# Patient Record
Sex: Female | Born: 1975 | Race: White | Hispanic: No | Marital: Married | Smoking: Never smoker
Health system: Southern US, Community
[De-identification: ages and names within clinical notes are randomized; demographics above are authoritative.]

---

## 2008-10-28 ENCOUNTER — Ambulatory Visit: Payer: Self-pay | Admitting: Internal Medicine

## 2009-10-19 ENCOUNTER — Encounter: Admission: RE | Admit: 2009-10-19 | Discharge: 2009-10-19 | Payer: Self-pay | Admitting: Occupational Medicine

## 2010-01-18 ENCOUNTER — Encounter: Admission: RE | Admit: 2010-01-18 | Discharge: 2010-01-18 | Payer: Self-pay | Admitting: Occupational Medicine

## 2010-05-01 ENCOUNTER — Ambulatory Visit: Payer: Self-pay | Admitting: Internal Medicine

## 2010-12-01 ENCOUNTER — Ambulatory Visit: Payer: Self-pay | Admitting: Family Medicine

## 2011-10-04 ENCOUNTER — Ambulatory Visit: Payer: Self-pay | Admitting: Family Medicine

## 2012-07-26 IMAGING — CR DG HAND COMPLETE 3+V*L*
3 series · 3 of 3 positions shown · non-contrast
Comparison: None.

CLINICAL DATA: Fall.  Left hand pain and swelling especially along
the thumb.

LEFT HAND - COMPLETE 3+ VIEW

[view not recorded (1 of 3)]
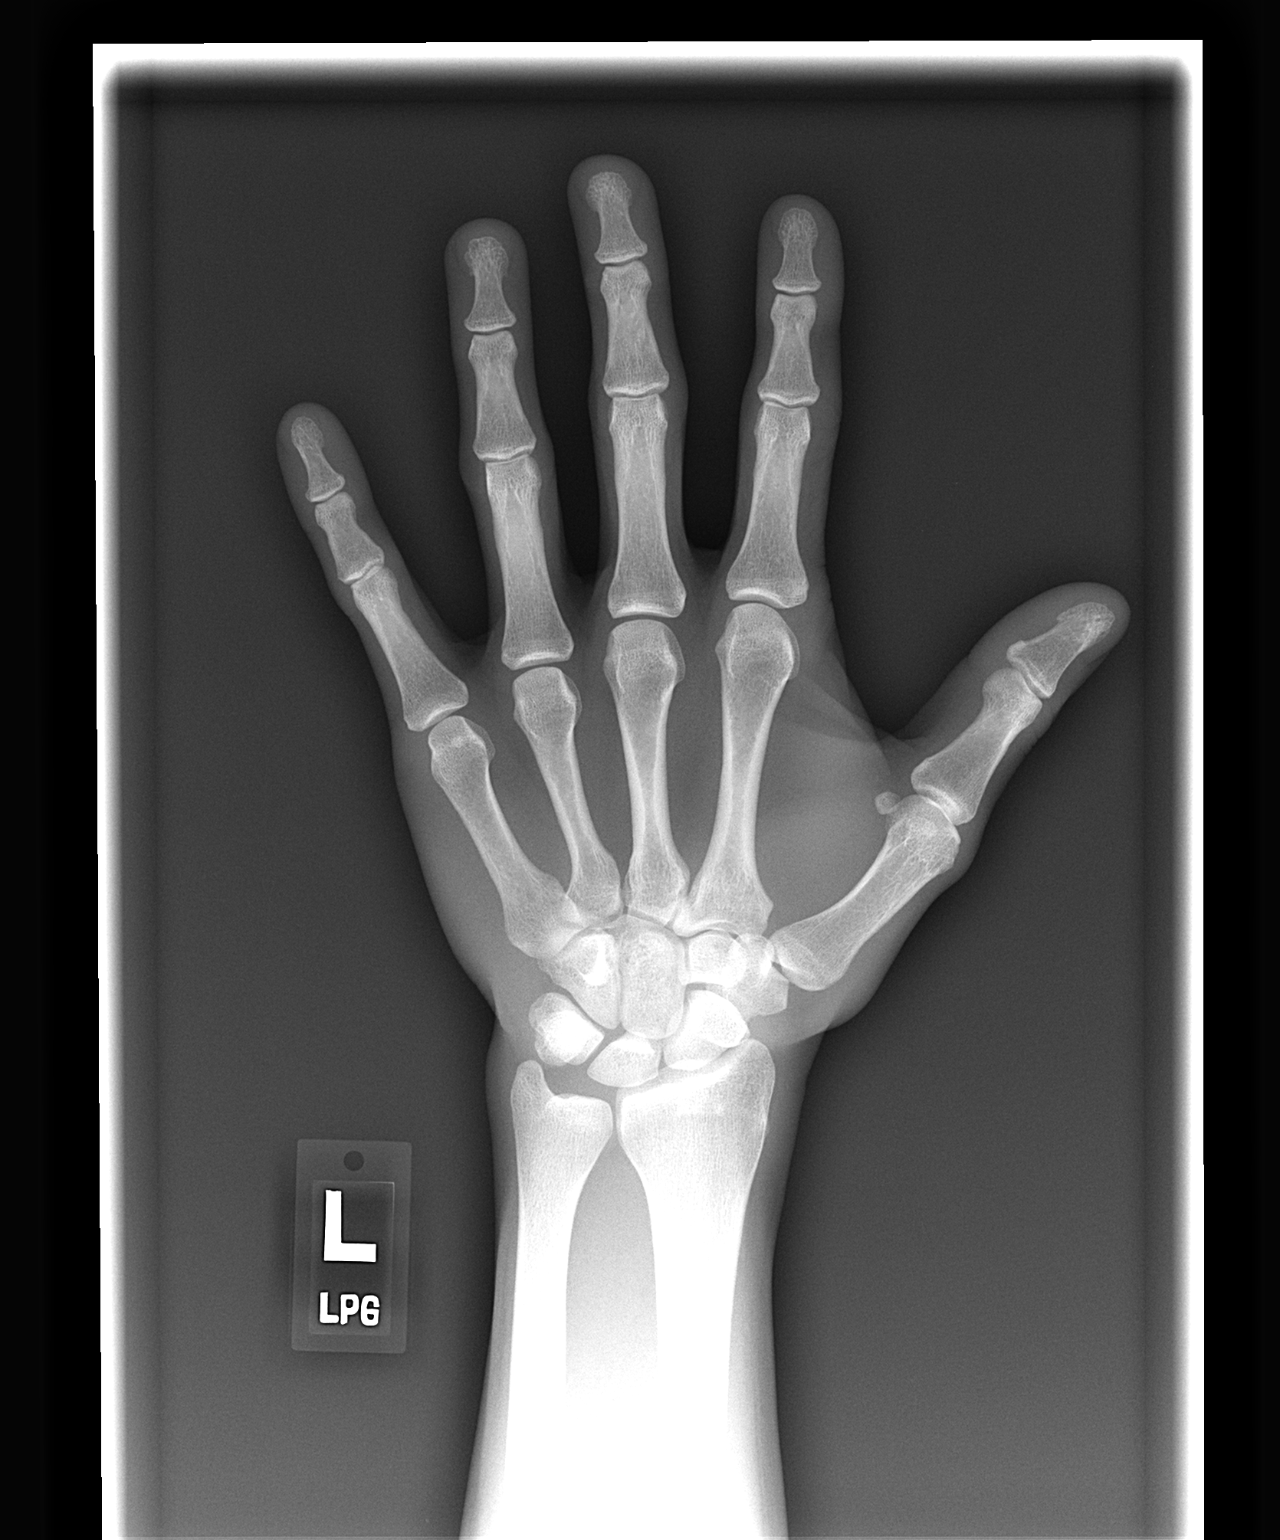

[view not recorded (2 of 3)]
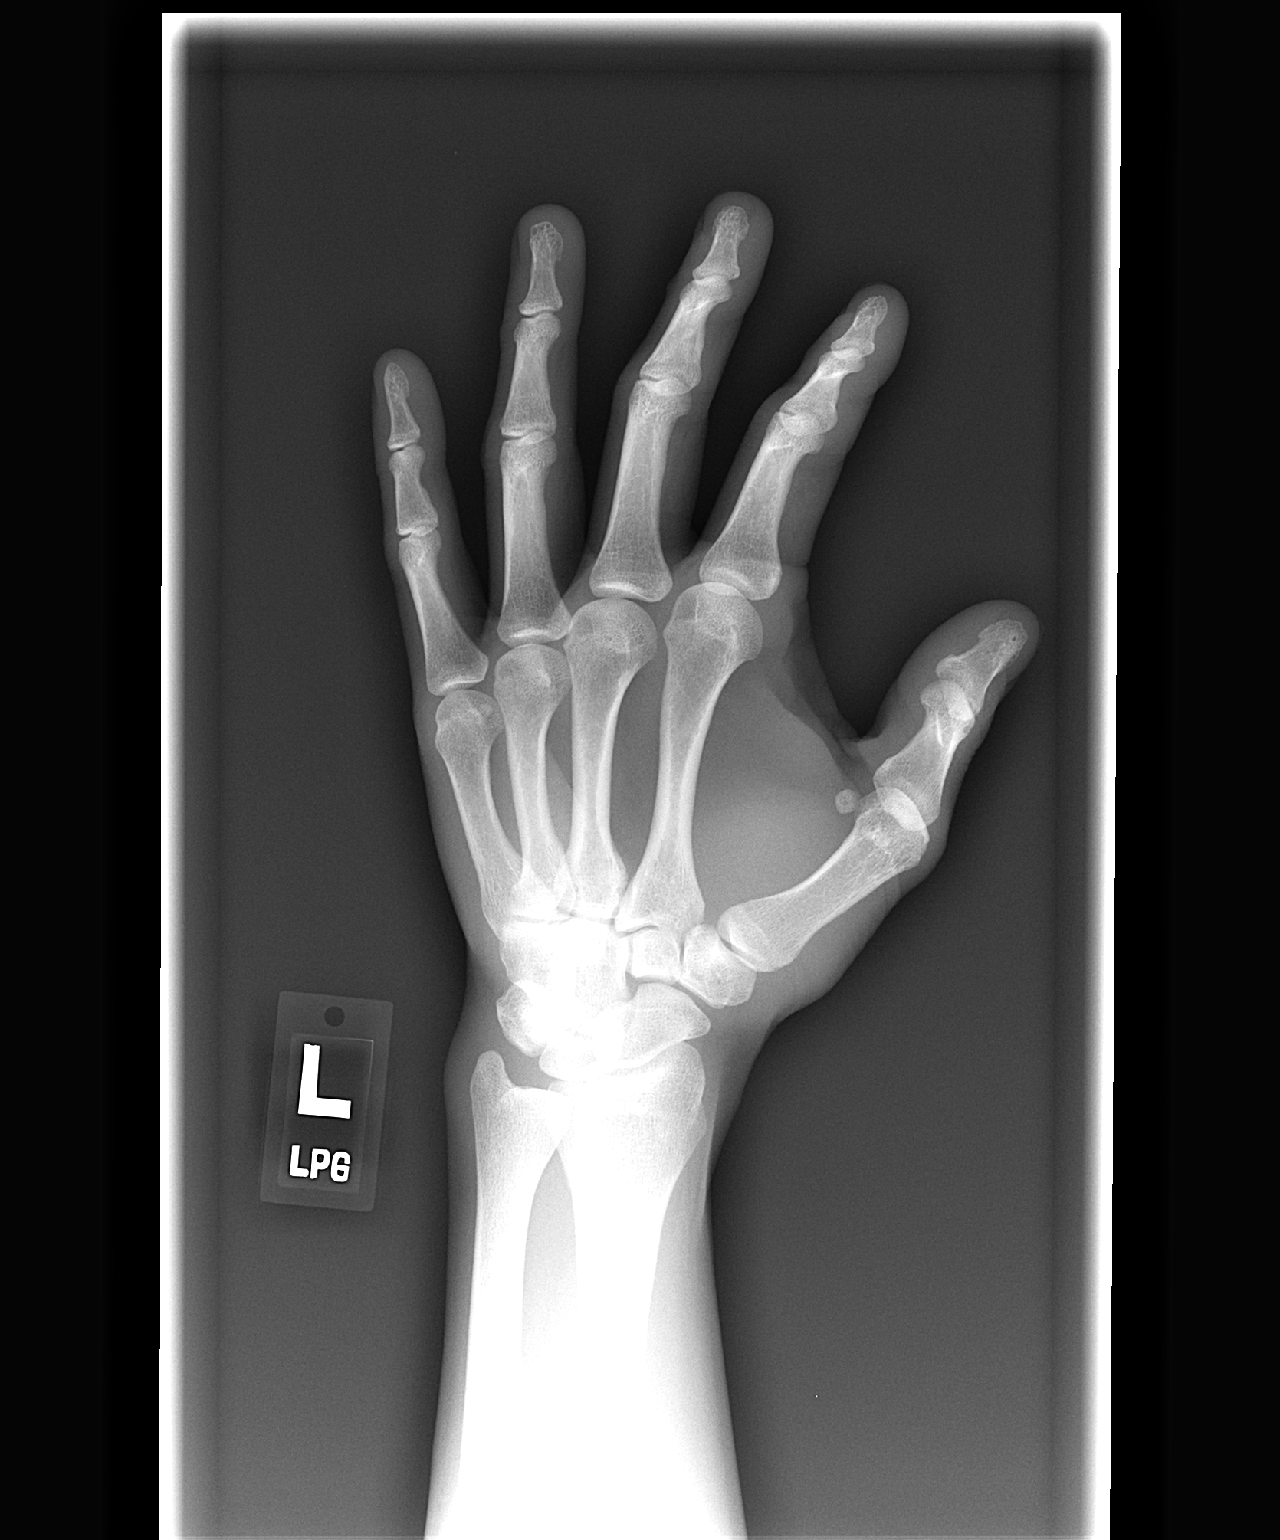

[view not recorded (3 of 3)]
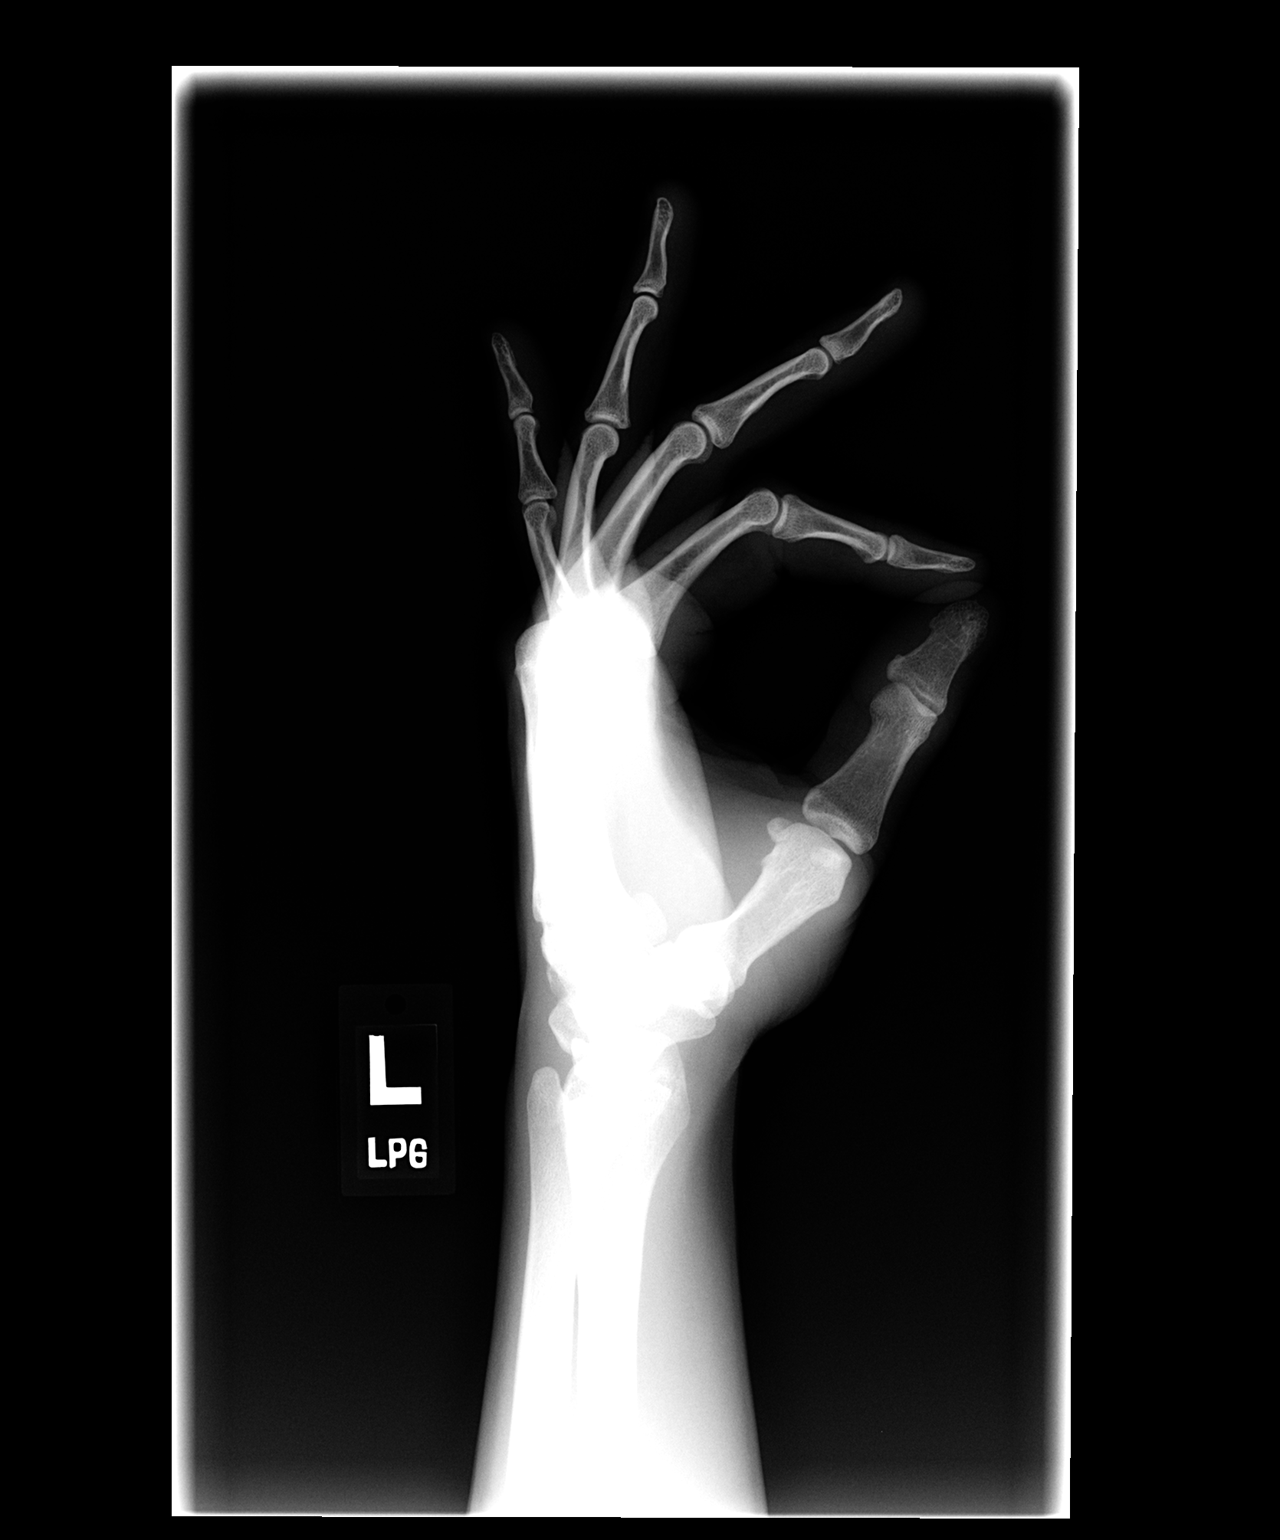

[3 of 3 positions shown; findings below may reference images not displayed]

FINDINGS: No fracture, foreign body, or acute bony findings are
identified.
IMPRESSION: No significant abnormality identified.

## 2012-11-18 ENCOUNTER — Other Ambulatory Visit: Payer: Self-pay | Admitting: Occupational Medicine

## 2012-11-18 ENCOUNTER — Ambulatory Visit
Admission: RE | Admit: 2012-11-18 | Discharge: 2012-11-18 | Disposition: A | Discharge status: Home / Self Care | Payer: No Typology Code available for payment source | Source: Ambulatory Visit | Attending: Occupational Medicine | Admitting: Occupational Medicine

## 2012-11-18 DIAGNOSIS — Z0000 Encounter for general adult medical examination without abnormal findings: Secondary | ICD-10-CM

## 2015-05-27 IMAGING — CR DG CHEST 2V
2 series · 2 of 2 positions shown · non-contrast
Comparison: 10/19/2009.

CLINICAL DATA: Physical exam.

CHEST - 2 VIEW

[view not recorded (1 of 2)]
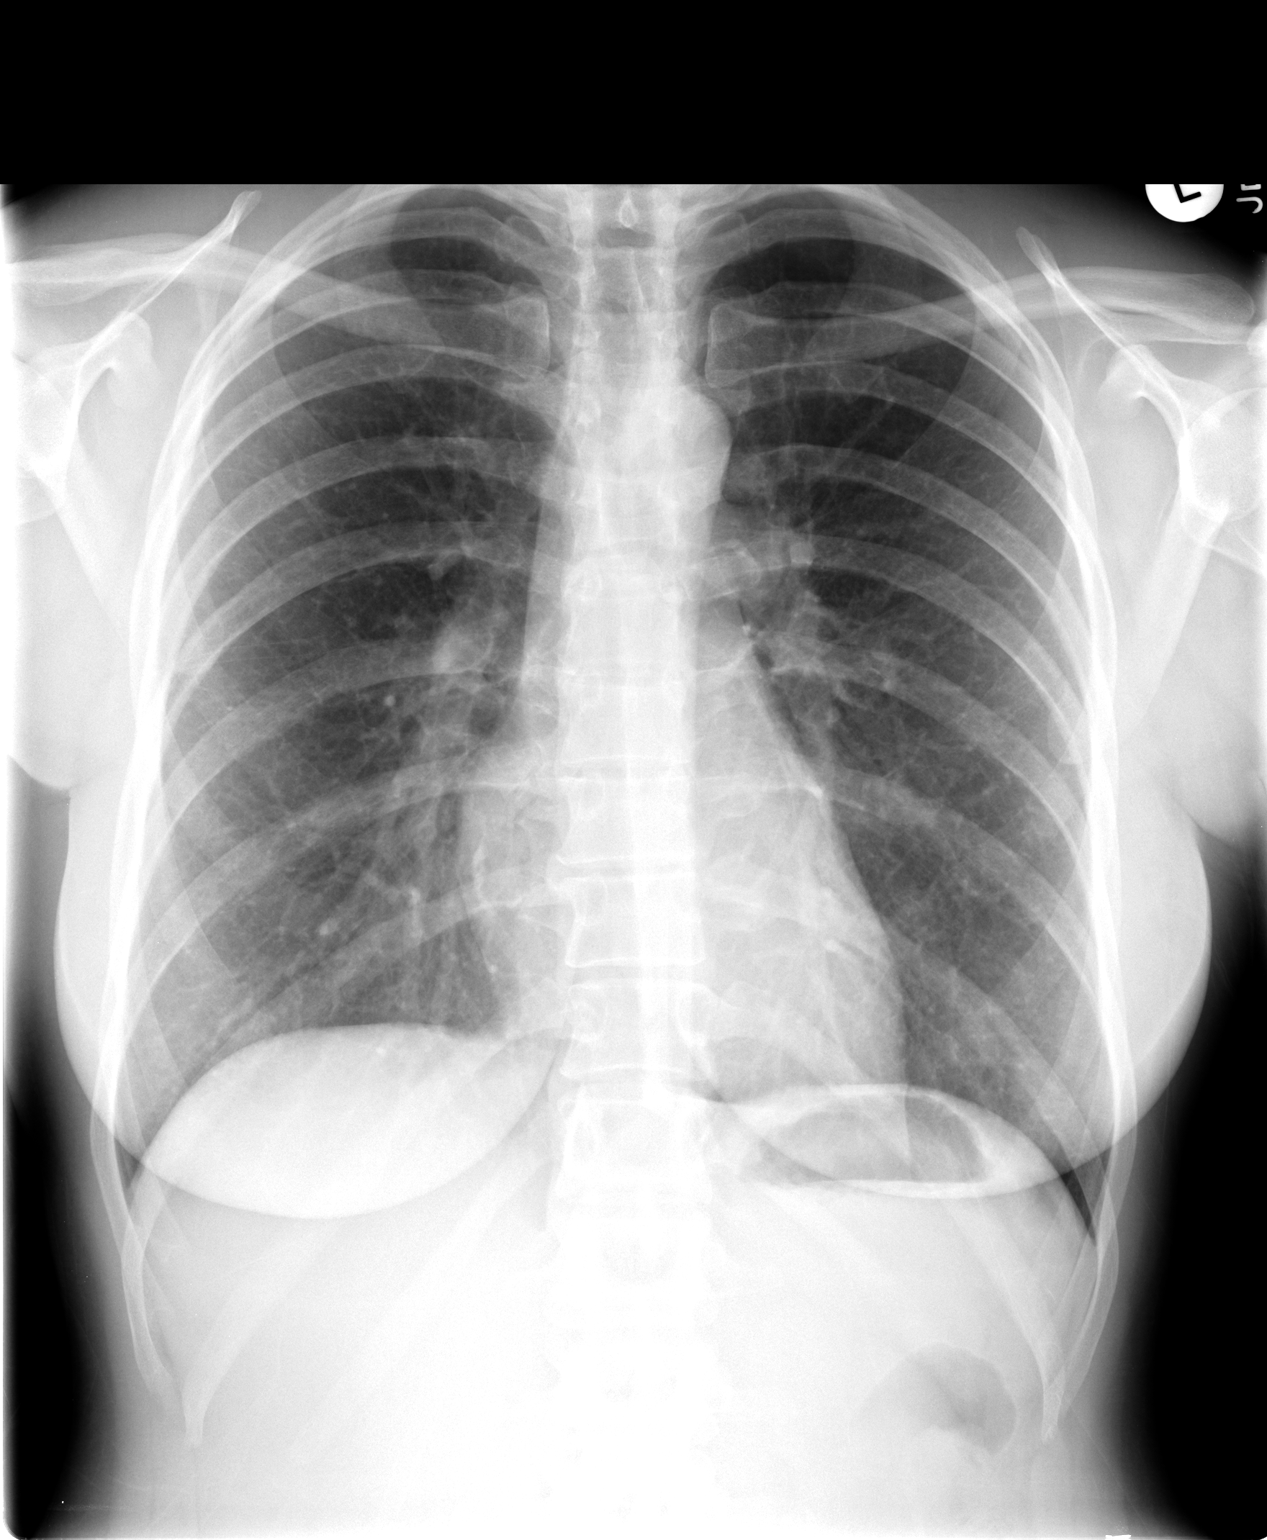

[view not recorded (2 of 2)]
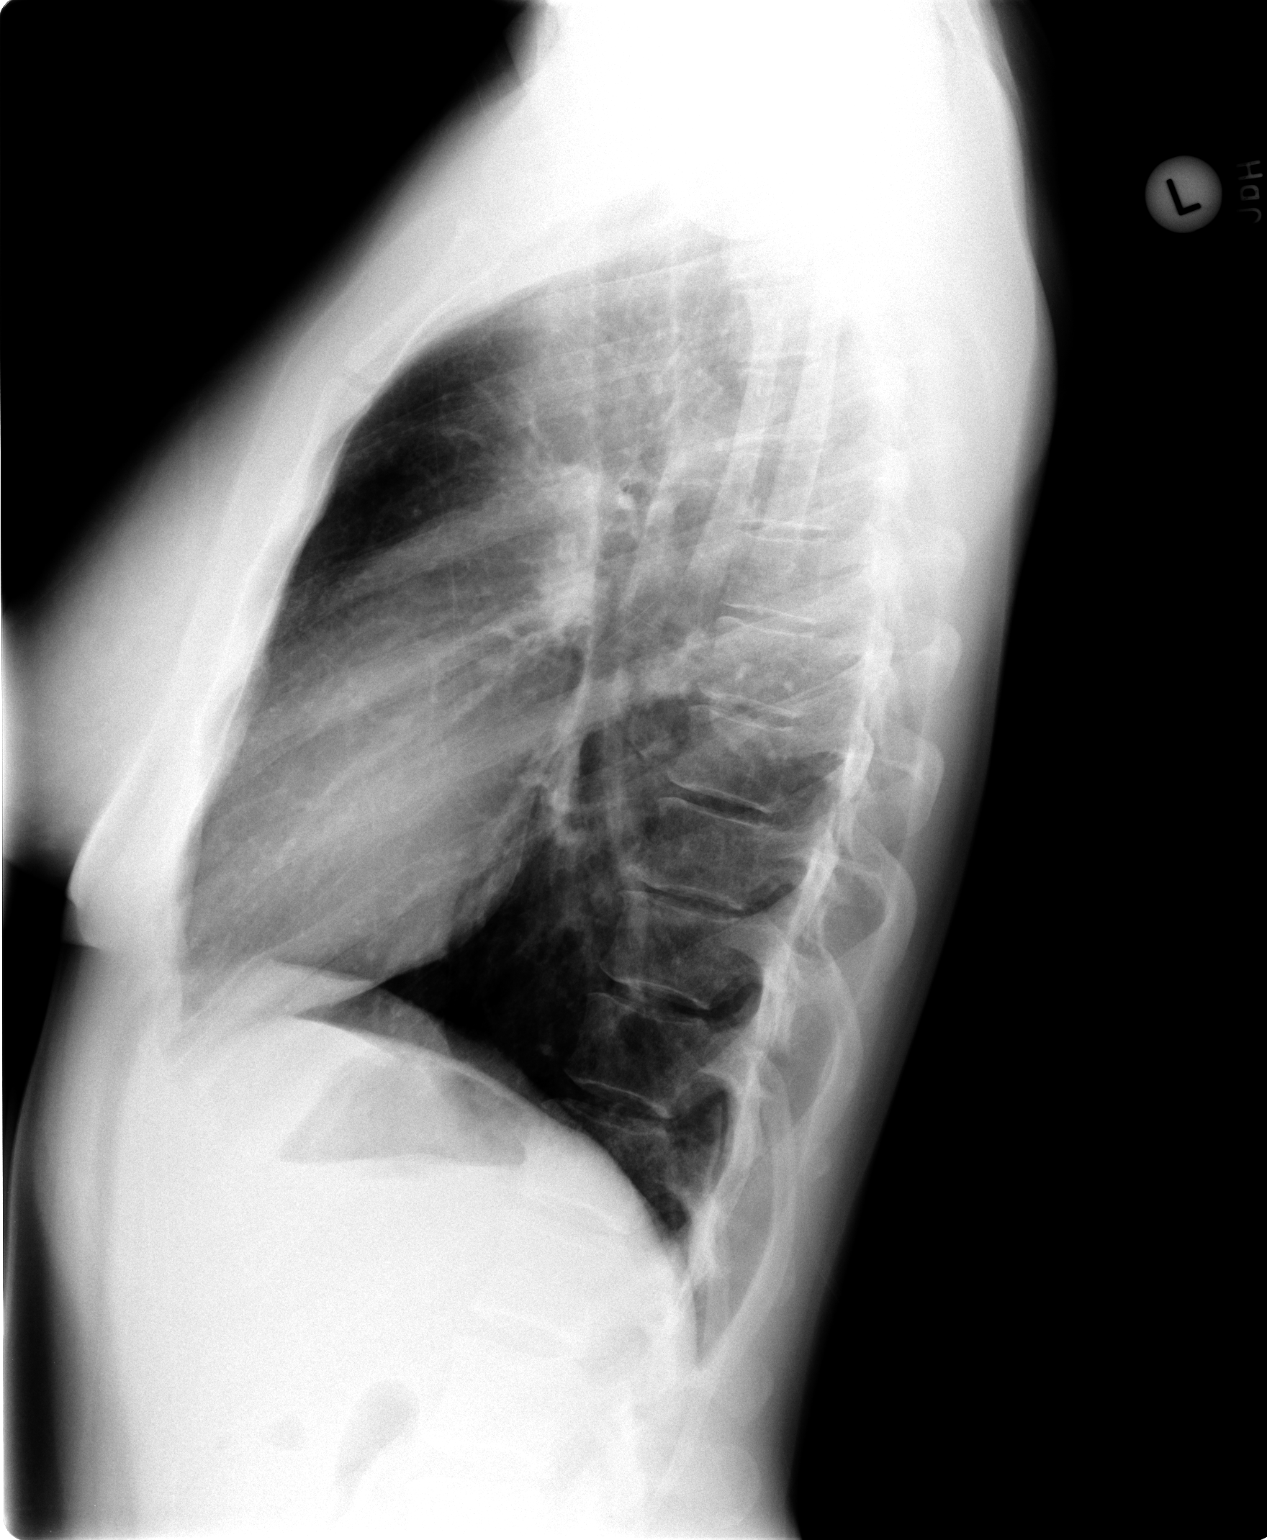

[2 of 2 positions shown; findings below may reference images not displayed]

FINDINGS: Trachea is midline.  Heart size normal.  Lungs are
hyperexpanded but clear.  No pleural fluid.
IMPRESSION: Hyperinflation without acute finding.

## 2019-06-18 ENCOUNTER — Encounter: Payer: Self-pay | Admitting: Emergency Medicine

## 2019-06-18 ENCOUNTER — Ambulatory Visit
Admission: EM | Admit: 2019-06-18 | Discharge: 2019-06-18 | Disposition: A | Discharge status: Home / Self Care | Payer: BC Managed Care – PPO | Attending: Family Medicine | Admitting: Family Medicine

## 2019-06-18 ENCOUNTER — Other Ambulatory Visit: Payer: Self-pay

## 2019-06-18 DIAGNOSIS — W228XXA Striking against or struck by other objects, initial encounter: Secondary | ICD-10-CM

## 2019-06-18 DIAGNOSIS — R0981 Nasal congestion: Secondary | ICD-10-CM

## 2019-06-18 DIAGNOSIS — S0992XA Unspecified injury of nose, initial encounter: Secondary | ICD-10-CM | POA: Diagnosis not present

## 2019-06-18 DIAGNOSIS — J0100 Acute maxillary sinusitis, unspecified: Secondary | ICD-10-CM

## 2019-06-18 MED ORDER — AMOXICILLIN-POT CLAVULANATE 875-125 MG PO TABS: 1.0000 | ORAL_TABLET | Freq: Two times a day (BID) | ORAL | 0 refills | Status: AC

## 2019-06-18 NOTE — Discharge Instructions (Signed)
Take medication as prescribed. Monitor.  Continue over-the-counter decongestants and supportive care.  Reevaluation for continued complaints.  Follow up with your primary care physician this week as needed. Return to Urgent care for new or worsening concerns.

## 2019-06-18 NOTE — ED Provider Notes (Signed)
[facility] ____________________________________________  Time seen: Approximately 12:14 PM  I have reviewed the triage vital signs and the nursing notes.   HISTORY  Chief Complaint Nasal Congestion and Facial Pain   HPI Julia Mclaughlin is a 44 y.o. female presented for evaluation of nasal congestion, sinus pressure and sinus drainage.  Patient reports this is been ongoing for the last 2 to 3 weeks.  Reports approximately 3 weeks ago she was helping move a bookshelf in the bookshelf hit directly to the tip of her nose and pressed upwards to the right side.  States she feels like that inflamed her nose and then with her normal allergies her symptoms congested and continued.  Patient states she does not feel like she broke her nose.  States in the last couple days she has had continued increase congestion and pressure as well as her ears have felt warm.  Denies fevers, changes in taste or smell, sore throat, chest pain or shortness of breath.  Occasional cough.  Has been taken Advil decongestants which helped but no resolution.  Denies known sick contacts.  Patient declines Covid testing.  Denies recent sickness or antibiotic use otherwise.  Patient's last menstrual period was 06/04/2019.    History reviewed. No pertinent past medical history.  There are no problems to display for this patient.   History reviewed. No pertinent surgical history.   No current facility-administered medications for this encounter.  Current Outpatient Medications:  .  amoxicillin-clavulanate (AUGMENTIN) 875-125 MG tablet, Take 1 tablet by mouth every 12 (twelve) hours., Disp: 20 tablet, Rfl: 0 .  SPRINTEC 28 0.25-35 MG-MCG tablet, Take 1 tablet by mouth daily., Disp: , Rfl:  .  valACYclovir (VALTREX) 500 MG tablet, Take 500 mg by mouth daily., Disp: , Rfl:   Allergies Patient has no known allergies.  Family History  Problem Relation Age of Onset  . Healthy Mother   . Healthy Father     Social History Social History   Tobacco Use  . Smoking status: Never Smoker  . Smokeless tobacco: Never Used  Substance Use Topics  . Alcohol use: Yes  . Drug use: Never    Review of Systems Constitutional: No fever ENT: No sore throat. As above.  Cardiovascular: Denies chest pain. Respiratory: Denies shortness of breath. Gastrointestinal: No abdominal pain.  No nausea, no vomiting.  No diarrhea. Musculoskeletal: Negative for back pain. Skin: Negative for rash.   ____________________________________________   PHYSICAL EXAM:  VITAL SIGNS: ED Triage Vitals  Enc Vitals Group     BP 06/18/19 1124 (!) 122/91     Pulse Rate 06/18/19 1124 76     Resp 06/18/19 1124 18     Temp 06/18/19 1124 98.2 F (36.8 C)     Temp Source 06/18/19 1124 Oral     SpO2 06/18/19 1124 100 %     Weight 06/18/19 1121 152 lb (68.9 kg)     Height 06/18/19 1121 5\' 4"  (1.626 m)     Head Circumference --      Peak Flow --      Pain Score 06/18/19 1121 0     Pain Loc --      Pain Edu? --      Excl. in [place]? --    Constitutional: Alert and oriented. Well appearing and in no acute distress. Eyes: Conjunctivae are normal.  Head: Atraumatic.Mild tenderness to palpation right frontal and maxillary sinuses. No swelling. No erythema.   Ears: no erythema, mild effusion bilaterally, no  erythema, otherwise normal TMs bilaterally.   Nose: nasal congestion with bilateral nasal turbinate erythema and edema.  No point nasal bridge tenderness, mild tenderness right nasal bridge adjacent, no swelling, no ecchymosis, no obvious deviation.  Mouth/Throat: Mucous membranes are moist. Oropharynx non-erythematous.No tonsillar swelling or exudate.  Neck: No stridor.  No cervical spine tenderness to palpation. Hematological/Lymphatic/Immunilogical: No cervical lymphadenopathy. Cardiovascular: Normal rate, regular rhythm. Grossly normal heart sounds.  Good peripheral circulation. Respiratory: Normal respiratory effort.  No  retractions. No wheezes, rales or rhonchi. Good air movement.  Musculoskeletal: Steady gait.  Neurologic:  Normal speech and language. No gross focal neurologic deficits are appreciated. No gait instability. Skin:  Skin is warm, dry and intact. No rash noted. Psychiatric: Mood and affect are normal. Speech and behavior are normal.  ___________________________________________   LABS (all labs ordered are listed, but only abnormal results are displayed)  Labs Reviewed - No data to display  PROCEDURES Procedures    INITIAL IMPRESSION / ASSESSMENT AND PLAN / ED COURSE  Pertinent labs & imaging results that were available during my care of the patient were reviewed by me and considered in my medical decision making (see chart for details).  Well-appearing patient.  No acute distress.  Suspect sinusitis.  Also with recent nasal injury, counseled regarding potential nasal fracture, and discussed imaging.  Patient declined imaging at this time and states she will follow up if symptoms persist.  Encouraged continued decongestants and supportive care.Discussed indication, risks and benefits of medications with patient.  Discussed follow up and return parameters including no resolution or any worsening concerns. Patient verbalized understanding and agreed to plan.   ____________________________________________   FINAL CLINICAL IMPRESSION(S) / ED DIAGNOSES  Final diagnoses:  Acute maxillary sinusitis, recurrence not specified  Injury of nose, initial encounter     ED Discharge Orders         Ordered    amoxicillin-clavulanate (AUGMENTIN) 875-125 MG tablet  Every 12 hours     06/18/19 1152           Note: This dictation was prepared with Dragon dictation along with smaller phrase technology. Any transcriptional errors that result from this process are unintentional.         Renford Dills, NP 06/18/19 1230

## 2019-06-18 NOTE — ED Triage Notes (Addendum)
Patient states she helping someone move a bookshelf about 3 weeks ago and it hit her nose. She states she was swollen in her sinus area. She states she has had nasal congestion and drainage x 3 weeks now. Denies cough and denies fever. Patient refusing COVID testing.

## 2022-12-06 ENCOUNTER — Other Ambulatory Visit: Payer: Self-pay | Admitting: Physical Medicine & Rehabilitation

## 2022-12-06 DIAGNOSIS — M542 Cervicalgia: Secondary | ICD-10-CM

## 2022-12-27 ENCOUNTER — Ambulatory Visit
Admission: RE | Admit: 2022-12-27 | Discharge: 2022-12-27 | Disposition: A | Discharge status: Home / Self Care | Payer: BC Managed Care – PPO | Source: Ambulatory Visit | Attending: Physical Medicine & Rehabilitation | Admitting: Physical Medicine & Rehabilitation

## 2022-12-27 DIAGNOSIS — M542 Cervicalgia: Secondary | ICD-10-CM

## 2023-01-05 ENCOUNTER — Encounter: Payer: Self-pay | Admitting: Physical Medicine & Rehabilitation
